# Patient Record
Sex: Male | Born: 1960 | Race: White | Hispanic: No | Marital: Married | Smoking: Never smoker
Health system: Southern US, Community
[De-identification: ages and names within clinical notes are randomized; demographics above are authoritative.]

## PROBLEM LIST (undated history)

## (undated) DIAGNOSIS — C801 Malignant (primary) neoplasm, unspecified: Secondary | ICD-10-CM

## (undated) DIAGNOSIS — I10 Essential (primary) hypertension: Secondary | ICD-10-CM

## (undated) HISTORY — PX: JOINT REPLACEMENT: SHX530

## (undated) HISTORY — PX: OTHER SURGICAL HISTORY: SHX169

---

## 2002-04-24 ENCOUNTER — Encounter: Payer: Self-pay | Admitting: Orthopedic Surgery

## 2002-04-30 ENCOUNTER — Encounter: Payer: Self-pay | Admitting: Orthopedic Surgery

## 2002-04-30 ENCOUNTER — Inpatient Hospital Stay (HOSPITAL_COMMUNITY): Admission: RE | Admit: 2002-04-30 | Discharge: 2002-05-03 | Payer: Self-pay | Admitting: Orthopedic Surgery

## 2002-07-24 ENCOUNTER — Encounter: Payer: Self-pay | Admitting: Orthopedic Surgery

## 2002-07-24 ENCOUNTER — Ambulatory Visit (HOSPITAL_COMMUNITY): Admission: RE | Admit: 2002-07-24 | Discharge: 2002-07-24 | Payer: Self-pay | Admitting: Orthopedic Surgery

## 2004-03-09 ENCOUNTER — Inpatient Hospital Stay (HOSPITAL_COMMUNITY): Admission: RE | Admit: 2004-03-09 | Discharge: 2004-03-11 | Payer: Self-pay | Admitting: Orthopedic Surgery

## 2005-01-13 ENCOUNTER — Encounter: Admission: RE | Admit: 2005-01-13 | Discharge: 2005-01-13 | Payer: Self-pay | Admitting: Orthopedic Surgery

## 2006-11-03 ENCOUNTER — Emergency Department (HOSPITAL_COMMUNITY): Admission: EM | Admit: 2006-11-03 | Discharge: 2006-11-03 | Payer: Self-pay | Admitting: Emergency Medicine

## 2006-11-03 ENCOUNTER — Ambulatory Visit: Payer: Self-pay | Admitting: Vascular Surgery

## 2006-11-08 ENCOUNTER — Encounter: Admission: RE | Admit: 2006-11-08 | Discharge: 2006-11-08 | Payer: Self-pay | Admitting: *Deleted

## 2010-04-19 ENCOUNTER — Encounter: Payer: Self-pay | Admitting: *Deleted

## 2010-08-14 NOTE — Discharge Summary (Signed)
Eric Zhang, Eric Zhang              ACCOUNT NO.:  [id number]   MEDICAL RECORD NO.:  [id number]          PATIENT TYPE:  INP   LOCATION:  0483                         FACILITY:  [facility]   PHYSICIAN:  Ollen Gross, M.D.    DATE OF BIRTH:  January 26, 1961   DATE OF ADMISSION:  03/09/2004  DATE OF DISCHARGE:  03/11/2004                                 DISCHARGE SUMMARY   ADMISSION DIAGNOSES:  1.  Avascular necrosis, right hip.  2.  History of blood clots.  3.  Eczema.  4.  Past history of prepatellar bursitis with Staphylococcal infection.  5.  Status post left total hip arthroplasty, February 2004.   DISCHARGE DIAGNOSES:  1.  Avascular necrosis, right hip, status post right total hip arthroplasty.  2.  History of blood clots.  3.  Eczema.  4.  Past history of prepatellar bursitis with Staphylococcal infection.  5.  Status post left total hip arthroplasty, February 2004.   PROCEDURE:  Date of surgery:  March 09, 2004, right total hip  arthroplasty.  Surgeon:  Dr. Lequita Halt.  Assistant:  Avel Peace, PA-C.  Anesthesia:  General.  Blood loss:  350 mL.  Hemovac drain x 1.   CONSULTS:  None.   BRIEF HISTORY:  Eric Zhang is a 50 year old male with osteonecrosis of the right  hip, stage 4 disease, and collapse of the femoral head.  He had a previous  left total hip and done well and now presents for his right total hip.   LABORATORY DATA:  CBC preop:  Hemoglobin 12.8, hematocrit 37.3, normal  differential, postop hemoglobin 10.9, H&H 10.4 and 30.3.  PT/PTT preop 11.9  and 26, respectively, and INR 0.8.  Serial pro times followed.  Last noted  PT/INR 13.5 and 1.0.  Chem panel on admission all within normal limits.  Serial BMETs were followed.  Glucose went up from 109 to 130 and back down  to 125.  Electrolytes remained within normal limits.  Preop UA moderate  hemoglobin, positive protein, 0-2 red cells, otherwise negative.  Blood  group type A positive.   Hip and pelvis films on March 02, 2004:  Status post total hip  arthroplasty without complications.   EKG, March 05, 2004:  Normal sinus rhythm, normal EKG, confirmed by Dr.  Lenise Herald.  Two-view chest preop on March 05, 2004:  No active  disease.  Preop hip films:  Severe right hip avascular necrosis, also left  hip prosthesis noted.   HOSPITAL COURSE:  The patient admitted to [facility] and underwent  the procedure, tolerated the procedure well and later returned to the  recovery room, was placed on Coumadin and Lovenox for concomitant coverage  for a history of clot.  He did have a little of nausea on the evening of  surgery.  It was a little bit better by the next day, treated with  antiemetics, started to get up with physical therapy.  By day two, the  nausea was better.  His headache, which he also had on day one, had  improved.  He was feeling much better, asking about the  possibility of going  home.  He progressed well with his physical therapy and on the following  day, March 11, 2004, he was tolerating his medications.  He had been  weaned over to p.o. medications.  He was up ambulating with therapy and was  discharged home.   DISCHARGE MEDICATIONS AND PLAN:  The patient discharged home on March 11, 2004.   DISCHARGE DIAGNOSES:  Please see above.   DISCHARGE MEDICATIONS:  Percocet, Robaxin, and Coumadin.   DIET:  Resume previous home diet.   ACTIVITY:  Partial weightbearing 50% right lower extremity.  Gait training,  ambulation, ADLs.  Home health PT, home health nursing.  May start  showering.  Daily dressing change.  Follow up 2 weeks from surgery.   DISPOSITION:  Home.   CONDITION ON DISCHARGE:  Improved.      ALP/MEDQ  D:  04/22/2004  T:  04/22/2004  Job:  [id number]

## 2010-08-14 NOTE — H&P (Signed)
NAME:  Eric Zhang, Eric Zhang NO.:  [id number]   MEDICAL RECORD NO.:  [id number]                   PATIENT TYPE:  INP   LOCATION:  0465                                 FACILITY:  [facility]   PHYSICIAN:  Ollen Gross, M.D.                 DATE OF BIRTH:  11/02/1960   DATE OF ADMISSION:  04/30/2002  DATE OF DISCHARGE:                                HISTORY & PHYSICAL   DATE OF OFFICE VISIT HISTORY AND PHYSICAL:  April 26, 2002.   CHIEF COMPLAINT:  Left hip pain.   HISTORY OF PRESENT ILLNESS:  The patient is a 50 year old male who has been  seen by Dr. Lequita Halt for hip pain.  He is noted to have avascular necrosis of  both hips.  The left hip, unfortunately, is more symptomatic than the right.  The pain in his hips has been going on for approximately over a year now.  It initially started out that the right hip was more painful.  However,  recently the left hip started to affect him more and has been a crescendo-  type pain.  He was seen in follow-up after his MRI.  It did show early  collapse with over 50% involvement of the femoral head on the left.  It is  discussed with him the most predictable means of improving his pain was  total hip replacement.  This was discussed at length with the patient.  Due  to his young age it was recommended metal-on-metal bearing surface.  The  risks and benefits were discussed, and the patient has elected to proceed  with surgery.   ALLERGIES:  No known drug allergies.   CURRENT MEDICATIONS:  Hydrocodone for pain.   PAST MEDICAL HISTORY:  1. History of AVN.  2. History of phlebitis.   PAST SURGICAL HISTORY:  Negative.   SOCIAL HISTORY:  He is married, no children.  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   TOBACCO/ALCOHOL:  Approximately 20 years for three-pack per day; however,  quit four years ago.  Drinks approximately one-half pint a day of alcohol;  however, he has stopped this recently.   FAMILY HISTORY:   Mother living, age 83, with a history of MI, breast cancer,  and also intestinal bypass.  Father living, age 85, with carotid disease.   REVIEW OF SYSTEMS:  GENERAL:  No fevers, chills, or night sweats.  NEUROLOGIC:  No seizures, syncope, or paralysis.  RESPIRATORY:  No shortness  of breath, productive cough, or hemoptysis.  CARDIOVASCULAR:  No chest pain,  angina, or orthopnea.  GASTROINTESTINAL:  No nausea, vomiting, diarrhea, or  constipation.  GENITOURINARY:  No dysuria, hematuria, or discharge.  MUSCULOSKELETAL:  Pertinent to both hips as found in the history of present  illness.   PHYSICAL EXAMINATION:  VITAL SIGNS:  Pulse 76, respirations 14, blood  pressure 122/82.  GENERAL:  The patient is a 50 year old  white male, well-nourished, well-  developed, large-framed individual.  Alert, oriented, and cooperative.  HEENT:  Normocephalic, atraumatic.  Pupils round and reactive.  Oropharynx  clear.  EOMs are intact.  NECK:  Supple.  No carotid bruits.  CHEST:  Clear to auscultation anterior and posterior chest walls.  HEART:  Regular rate and rhythm, with an early faint systolic murmur grade  2/6.  S1, S2 noted.  ABDOMEN:  Soft and nontender.  Slightly round.  Bowel sounds are present.  RECTAL, BREASTS, GENITALIA:  Not done.  Not pertinent to present illness.  EXTREMITIES:  Limited to that of both hips:  Both hips are able to flex up  to about 110 degrees, internal and external rotation of 20 and 40  respectively, with abduction of about 40 degrees.  He does ambulate with an  antalgic gait.  Please note, the left is more symptomatic than the right.   IMPRESSION:  1. Bilateral avascular necrosis of hips, left more symptomatic than right.  2. History of phlebitis.   PLAN:  The patient will be admitted to [facility] to undergo a  left total hip replacement arthroplasty.  Surgery will be performed by Dr.  Ollen Gross, with anticipation of a metal-on-metal bearing  surface.     Alexzandrew L. Julien Girt, P.A.              Ollen Gross, M.D.    ALP/MEDQ  D:  05/01/2002  T:  05/01/2002  Job:  [id number]

## 2010-08-14 NOTE — Discharge Summary (Signed)
NAME:  Eric Zhang, Eric Zhang                        ACCOUNT NO.:  [id number]   MEDICAL RECORD NO.:  [id number]                   PATIENT TYPE:  INP   LOCATION:  0465                                 FACILITY:  [facility]   PHYSICIAN:  Ollen Gross, M.D.                 DATE OF BIRTH:  April 12, 1960   DATE OF ADMISSION:  04/30/2002  DATE OF DISCHARGE:  05/03/2002                                 DISCHARGE SUMMARY   ADMITTING DIAGNOSES:  1. Bilateral avascular necrosis hips, left more symptomatic than right.  2. History of phlebitis.   DISCHARGE DIAGNOSES:  1. Avascular necrosis left hip status post left total hip replacement     arthroplasty.  2. Avascular necrosis right hip.  3. History of phlebitis.   PROCEDURE:  The patient was taken to the operating room on April 30, 2002.  Underwent a left total hip replacement arthroplasty.  Surgeon Ollen Gross,  M.D.  Assistant Alexzandrew L. Julien Girt, P.A.  Anesthesia general.  Estimated  blood loss 500 mL.  Hemovac drain x1.   CONSULTS:  None.   BRIEF HISTORY:  The patient is a 50 year old male seen by Ollen Gross,  M.D. for hip pain.  He has undergone outpatient work-up including MRI which  just showed avascular necrosis of both hips.  MRI showed early collapse and  over 50% involvement of the femoral head on the left.  The patient's pain  has been very progressive over the past year to the point where it is  interfering with his daily activities.  It is felt he would benefit from  undergoing total hip replacement.  Risks and benefits discussed.  The  patient is subsequently admitted to the hospital.   LABORATORY DATA:  CBC on admission:  Hemoglobin 13.4, hematocrit 37.9, white  cell count 5.9, red cell count 4.25.  Postoperative H&H 10.2 and 29.0.  Last  noted H&H 10.0 and 28.0.  The differential on the admission CBC all within  normal limits.  PT/PTT on admission 12.3 and 29, respectively.  INR 0.9 on  admission.  Serial pro times  followed.  Last noted PT/INR 14.2 and 1.1.  Chemistry panel on admission all within normal limits with the exception of  only minimally elevated glucose of 127.  Follow-up BMET after surgery:  Minimally elevated glucose of 135.  Remaining BMET all within normal limits.  Urinalysis showed small blood with 0-2 red cells.  Otherwise, urinalysis  negative.  Blood group type A+.   EKG dated April 24, 2002:  Normal sinus rhythm.  Nonspecific T-wave  abnormalities.  No previous tracing.  Confirmed by Olga Millers, M.D. [facility].  Preoperative chest x-ray dated April 24, 2002:  No active disease.  Left  hip films show aseptic necrosis of the left femoral head.  Pelvis film shows  aseptic necrosis of both right and left femoral heads.  Postoperative pelvis  and left hip films dated April 30, 2002:  Normal alignment at the level of  the left hip joint status post total hip arthroplasty.   HOSPITAL COURSE:  The patient was admitted to [facility], taken to  the OR, underwent above stated procedure without complications.  The patient  tolerated procedure well.  Later transferred to recovery room and then to  the orthopedic floor for continued postoperative care.  Vital signs were  followed.  Given 24 hours of post IV antibiotics.  Placed on Coumadin for  DVT prophylaxis.  With his history of phlebitis he was also placed on  Lovenox coverage until he was therapeutic.  He was placed on PC analgesics  for pain control following surgery.  Hemovac drain placed at time of surgery  was pulled on postoperative day one.  The patient had a fairly rough night  the first night but he was doing much better by postoperative day one.  He  was weaned over to p.o. medications and the PCA was discontinued by  postoperative day two.  He did have a mild headache which was his biggest  complaint on postoperative day two.  Otherwise, he was passing flatus and  his pain was under better control.  PT and OT were  consulted to assist with  gait training ambulation and ADLs postoperatively.  The patient did progress  very well with physical therapy, up ambulating approximately 60 feet by the  morning of postoperative day two and then 100 feet by that evening.  He was  even walking 200 feet by postoperative day three.  The patient was feeling  much better by postoperative day three, tolerating his p.o. medications, and  was ready to go home.  Discharge planning helped arrange post hospital care  and he was released later that day.   DISCHARGE PLAN:  The patient discharged home on May 03, 2002.   DISCHARGE DIAGNOSES:  Please see above.   DISCHARGE MEDICATIONS:  1. Coumadin as per pharmacy protocol.  2. Percocet for pain.  3. Robaxin for spasm.   DIET:  As tolerated.   FOLLOW UP:  The patient to follow up in the office around February 17 or  February 19.  Call the office for an appointment [phone].   ACTIVITY:  Total hip protocol.  [facility] for home health PT, home  health nursing.  He is touchdown weightbearing.  Hip precautions at all  times.   DISPOSITION:  Home.   CONDITION ON DISCHARGE:  Improved.     Alexzandrew L. Julien Girt, P.A.              Ollen Gross, M.D.    ALP/MEDQ  D:  05/25/2002  T:  05/25/2002  Job:  [id number]

## 2010-08-14 NOTE — Op Note (Signed)
Eric Zhang, Eric Zhang              ACCOUNT NO.:  [id number]   MEDICAL RECORD NO.:  [id number]          PATIENT TYPE:  INP   LOCATION:  0003                         FACILITY:  [facility]   PHYSICIAN:  Ollen Gross, M.D.    DATE OF BIRTH:  [birth year]   DATE OF PROCEDURE:  03/09/2004  DATE OF DISCHARGE:                                 OPERATIVE REPORT   PREOPERATIVE DIAGNOSIS:  Avascular necrosis, right hip.   POSTOPERATIVE DIAGNOSIS:  Avascular necrosis, right hip.   PROCEDURE:  Right total hip arthroplasty.   SURGEON:  Dr. Lequita Halt.   ASSISTANT:  Alexzandrew L. Julien Girt, P.A.   ANESTHESIA:  General.   ESTIMATED BLOOD LOSS:  350.   DRAINS:  Hemovac x1.   COMPLICATIONS:  None.   CONDITION:  Stable to recovery.   BRIEF CLINICAL NOTE:  Eric Zhang is a 50 year old male with osteonecrosis of his  right hip with stage IV disease and collapse of the femoral head.  He had a  previous left total hip arthroplasty which was very successful and presents  now for right total hip arthroplasty.   PROCEDURE IN DETAIL:  After successful administration of general anesthetic,  the patient was placed in the left lateral decubitus position with the right  side up and held with the hip positioner.  The right lower extremity was  isolated from his peroneum with plastic drapes and prepped and draped in the  usual sterile fashion.  A small posterolateral incision was made with a 10  blade through the subcutaneous tissue to the level of the fascia lata which  is incised in line with the skin incision.  The sciatic nerve is palpated  and protected and the short rotators isolated off the femur.  Capsulectomy  is performed and the hip is dislocated.  The center of the femoral head is  marked and a trial prosthesis placed such that the center of the trial head  corresponds to the center of his native femoral head.  The osteotomy line is  marked on the femoral neck and osteotomy made with an oscillating saw.   The  femoral head is then removed and acetabular exposure obtained.   The acetabular labrum and osteophytes were removed.  Reaming starts at a 47,  coursing increments of 2 to a 55 and then a 56 mm Pinnacle acetabular shell  is placed in anatomic position and transfixed with two dome screws.  A trial  36 mm neutral liner is placed.   The femur is addressed first with a canal finder and then irrigation.  Axial  reaming is performed to 15.5 mm, proximal reaming to a 20 F and the sleeve  reamed to a large.  A 20 F large sleeve was placed with a 20 x 15 stem and a  36+ 8 neck. We went 10 degrees beyond his native anteversion to get more  normal version.  A 36+ 0 head is then placed.  The hip is reduced with  excellent stability, full extension, full external rotation, 70 degrees of  flexion, 40 degrees of abduction, 90 degrees internal rotation and 90  degrees  flexion, 70 degrees internal rotation.  By placing the right leg on  top of the leg, the leg lengths are equal.  The hip is then dislocated and  all trials removed.  The permanent apex hole eliminator was placed into the  acetabular shell and then a permanent 36 mm neutral Ultramet metal liner is  placed into the shell.  This is a metal-on-metal hip replacement.  The  permanent 20 F large sleeve is placed into the proximal femur, then a 20 x  15 stem with a 36+ 8 neck, again 10 degrees beyond native anteversion.  The  36+ 0 head is then placed and the hip is reduced to the same stability  parameters.  The wound is copiously irrigated with saline solution and the  short rotators re-attached to the femur through drill holes.  The fascia  lata is closed over a Hemovac drain with interrupted #1 Vicryl, subcutaneous  closed with #1 and 2-0 Vicryl and subcuticular running 4-0 Monocryl.  A 20  cc of 0.25% Marcaine with epinephrine are injected into the subcutaneous  tissues.  The drain is hooked to suction.  A bulky sterile dressing is   applied and he is placed into a knee immobilizer, awakened and transported  to recovery in stable condition.     Drenda Freeze   FA/MEDQ  D:  03/09/2004  T:  03/09/2004  Job:  [id number]

## 2010-08-14 NOTE — H&P (Signed)
Eric Zhang, Eric Zhang              ACCOUNT NO.:  [id number]   MEDICAL RECORD NO.:  [id number]          PATIENT TYPE:  INP   LOCATION:  NA                           FACILITY:  [facility]   PHYSICIAN:  Ollen Gross, M.D.    DATE OF BIRTH:  09/11/1960   DATE OF ADMISSION:  03/09/2004  DATE OF DISCHARGE:                                HISTORY & PHYSICAL   DATE OF OFFICE VISIT/HISTORY AND PHYSICAL:  February 27, 2004.   DATE OF ADMISSION:  Tomorrow, March 09, 2004.   CHIEF COMPLAINT:  Right hip pain.   HISTORY OF PRESENT ILLNESS:  The patient is a 50 year old male who is well  known to Dr. Ollen Gross, having bilateral avascular necrosis, both hips.  He has previously undergone a left total hip arthroplasty in February of  last year and is doing extremely well from that.  He now presents for  continued progressive pain in the right hip associated with AVN.  He has  reached the point where he would like to have surgical intervention.  Risks  and benefits are discussed.  The patient is subsequently admitted to the  hospital.   ALLERGIES:  No known drug allergies.   CURRENT MEDICATIONS:  Diclofenac and Vicodin.   PAST MEDICAL HISTORY:  1.  Eczema.  2.  Prepatellar bursitis, with a staph infection.  3.  History of blood clots.   PAST SURGICAL HISTORY:  Left total hip arthroplasty, February 2004.   SOCIAL HISTORY:  Married.  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  Past smoker, stopped about 5-6 years ago, did smoke for  about 20 years.  Alcohol intake:  Occasional pint/six-pack daily.   FAMILY HISTORY:  Mother with a history of breast cancer and stroke.  Also,  mother with history of colitis.   REVIEW OF SYSTEMS:  GENERAL:  No fevers, chills or night sweats.  NEURO:  No  seizures, syncope or paralysis.  RESPIRATORY:  No shortness of breath,  productive cough or hemoptysis.  CARDIOVASCULAR:  No chest pain, angina or  orthopnea.  GI:  No nausea, vomiting,  diarrhea or constipation.  GU:  No  dysuria, hematuria or discharge.  MUSCULOSKELETAL:  Associated with the  right hip found in the history of present illness.   PHYSICAL EXAMINATION:  VITAL SIGNS:  Pulse 68, respirations 12, blood  pressure 133/82.  GENERAL:  A 50 year old white male, well nourished, well developed, a large  frame, no acute distress.  He is alert, oriented, cooperative and pleasant.  HEENT:  Normocephalic, atraumatic.  Pupils equal, round and reactive.  Oropharynx clear.  EOMs intact.  NECK:  Supple.  CHEST:  Clear, anterior and posterior chest walls.  No rhonchi, rales or  wheezing.  HEART:  Regular in rate and rhythm.  S1, S2 noted.  ABDOMEN:  Soft and nontender.  Bowel sounds present.  RECTAL/BREASTS/GENITALIA:  Not done.  Not pertinent to present illness.  EXTREMITIES:  Right lower extremity:  Hip flexion only shows 90 degrees.  There is only about 10 degrees of internal rotation, about 20  degrees of  external rotation and about 20 degrees of abduction.  Does ambulate with an  antalgic gait.  Does have pain on passive range of motion of the right hip.   IMPRESSION:  1.  Avascular necrosis, right hip.  2.  History of clots.  3.  Eczema.  4.  Past history of prepatellar bursitis with staphylococcal infection.  5.  Status post left total hip arthroplasty, February 2004.   PLAN:  Patient admitted to [facility] to undergo right total hip  arthroplasty.  Surgery will be performed by Dr. Ollen Gross.     Alex   ALP/MEDQ  D:  03/08/2004  T:  03/09/2004  Job:  [id number]   cc:   Ollen Gross, M.D.  [facility]  [address]  [address]  [place]  [place] [address]  Fax: [phone]

## 2010-08-14 NOTE — Op Note (Signed)
NAME:  Eric Zhang, Eric Zhang                        ACCOUNT NO.:  [id number]   MEDICAL RECORD NO.:  [id number]                   PATIENT TYPE:  INP   LOCATION:  X001                                 FACILITY:  [facility]   PHYSICIAN:  Ollen Gross, M.D.                 DATE OF BIRTH:  Aug 17, 1960   DATE OF PROCEDURE:  04/30/2002  DATE OF DISCHARGE:                                 OPERATIVE REPORT   PREOPERATIVE DIAGNOSIS:  Avascular necrosis, left hip.   POSTOPERATIVE DIAGNOSIS:  Avascular necrosis, left hip.   PROCEDURE:  Left total hip arthroplasty.   SURGEON:  Ollen Gross, M.D.   ASSISTANT:  Alexzandrew L. Julien Girt, P.A.   ANESTHESIA:  General.   ESTIMATED BLOOD LOSS:  500.   DRAINS:  Hemovac x1.   COMPLICATIONS:  None.   CONDITION:  Stable to recovery.   BRIEF CLINICAL NOTE:  The patient is a 50 year old male with severe  avascular necrosis of both hips, the left more symptomatic than the right.  He has collapse of the left femoral head with high-grade involvement of the  head.  He presents now for total hip arthroplasty secondary to failure of  nonoperative management.   DESCRIPTION OF PROCEDURE:  After the successful administration of general  anesthetic, the patient is placed in the right lateral decubitus position  with the left side up and held with the hip positioner.  The left lower  extremity is isolated from his perineum with plastic drapes and prepped and  draped in the usual sterile fashion.  A posterolateral incision is made with  a 10 blade through subcutaneous tissue to the level of the fascia lata,  which is incised in line with the skin incision.  Sciatic nerve is palpated  and protected and the short external rotators isolated off the femur.  Capsulectomy is performed, the hip is dislocated and the center of the  femoral head marked.  A trial prosthesis is placed as the center of the  trial head corresponds to the center of his native femoral head.  The  osteotomy line is marked on the femoral neck and osteotomy made with an  oscillating saw.  The femoral head and neck are removed and the femur  retracted anteriorly.  Acetabular exposure was then obtained.   Labrum is removed and osteophyte removed anteriorly.  Reaming starts with a  45, coursing in increments of 2 to a 53, then a 54 mm Pinnacle acetabular  shell was placed in anatomic position and transfixed with two domed screws.  The trial 36 mm neutral liner is placed.   The femur is prepared first with the canal finder and then irrigation.  Axial reaming is performed up to 15.5 mm, proximal reaming up to a 82F, and  the sleeve machined to a large.  A 82F large trial sleeve is placed with a  20 x 15 stem, a 36 +8 neck, and  36 +0 head.  We first matched his native  anteversion, which was only about 10 degrees; thus, I anteverted it  approximately 10 degrees more to give 20 degrees anteversion.  The hip was  reduced with outstanding stability, full extension, full external rotation  to 70 degrees flexion, 40 degrees abduction, at 90 degrees internal rotation  and 90 degrees flexion and 70 degrees internal rotation.  The hip was then  dislocated and all the trials removed.  The permanent apex hole eliminator  was placed into the acetabular shell, then the permanent 36 mm neutral metal  liner is placed.  This is a metal-on-metal hip.  The permanent 23F large  sleeve is placed in the proximal femur and a 20 x 15 stem with a 36 +8 neck  placed.  We anteverted this about 10 degrees beyond his native version.  The  permanent 36 +0 head is placed, hip reduced, and the wound copiously  irrigated with antibiotic solution.  The short rotators are reattached to  the femur through drill holes.  Fascia lata is closed over a Hemovac drain  with interrupted #1 Vicryl, subcu closed with interrupted #1 and interrupted  2-0 Vicryl, and subcuticular running 4-0 Monocryl.  The incision was cleaned  and  dried and Steri-Strips and a bulky sterile dressing applied.  The  patient subsequently awakened and transported to recovery in stable  condition.                                               Ollen Gross, M.D.    FA/MEDQ  D:  04/30/2002  T:  04/30/2002  Job:  [id number]

## 2010-09-22 ENCOUNTER — Other Ambulatory Visit: Payer: Self-pay | Admitting: Orthopedic Surgery

## 2010-09-22 ENCOUNTER — Other Ambulatory Visit (HOSPITAL_COMMUNITY): Payer: Self-pay | Admitting: Orthopedic Surgery

## 2010-09-22 ENCOUNTER — Ambulatory Visit (HOSPITAL_COMMUNITY)
Admission: RE | Admit: 2010-09-22 | Discharge: 2010-09-22 | Disposition: A | Discharge status: Home / Self Care | Payer: BC Managed Care – PPO | Source: Ambulatory Visit | Attending: Orthopedic Surgery | Admitting: Orthopedic Surgery

## 2010-09-22 ENCOUNTER — Encounter (HOSPITAL_COMMUNITY): Payer: BC Managed Care – PPO

## 2010-09-22 DIAGNOSIS — Z01811 Encounter for preprocedural respiratory examination: Secondary | ICD-10-CM | POA: Insufficient documentation

## 2010-09-22 DIAGNOSIS — Z01812 Encounter for preprocedural laboratory examination: Secondary | ICD-10-CM | POA: Insufficient documentation

## 2010-09-22 DIAGNOSIS — Z01810 Encounter for preprocedural cardiovascular examination: Secondary | ICD-10-CM | POA: Insufficient documentation

## 2010-09-22 DIAGNOSIS — M1710 Unilateral primary osteoarthritis, unspecified knee: Secondary | ICD-10-CM | POA: Insufficient documentation

## 2010-09-22 DIAGNOSIS — Z01818 Encounter for other preprocedural examination: Secondary | ICD-10-CM

## 2010-09-22 DIAGNOSIS — I10 Essential (primary) hypertension: Secondary | ICD-10-CM | POA: Insufficient documentation

## 2010-09-22 LAB — BASIC METABOLIC PANEL WITH GFR
BUN: 18 mg/dL (ref 6–23)
CO2: 24 meq/L (ref 19–32)
Calcium: 9.9 mg/dL (ref 8.4–10.5)
Chloride: 103 meq/L (ref 96–112)
Creatinine, Ser: 0.86 mg/dL (ref 0.50–1.35)
GFR calc Af Amer: >60 mL/min
GFR calc non Af Amer: >60 mL/min
Glucose, Bld: 131 mg/dL — ABNORMAL HIGH (ref 70–99)
Potassium: 3.8 meq/L (ref 3.5–5.1)
Sodium: 136 meq/L (ref 135–145)

## 2010-09-22 LAB — URINALYSIS, ROUTINE W REFLEX MICROSCOPIC
Bilirubin Urine: NEGATIVE
Glucose, UA: 100 mg/dL — AB
Hgb urine dipstick: NEGATIVE
Ketones, ur: NEGATIVE mg/dL
Nitrite: NEGATIVE
Protein, ur: NEGATIVE mg/dL
Urobilinogen, UA: 0.2 mg/dL (ref 0.0–1.0)
pH: 5 (ref 5.0–8.0)

## 2010-09-22 LAB — CBC
HCT: 39.7 % (ref 39.0–52.0)
Hemoglobin: 13.6 g/dL (ref 13.0–17.0)
MCH: 29.7 pg (ref 26.0–34.0)
MCHC: 34.3 g/dL (ref 30.0–36.0)
MCV: 86.7 fL (ref 78.0–100.0)
Platelets: 195 K/uL (ref 150–400)
RBC: 4.58 MIL/uL (ref 4.22–5.81)
RDW: 13 % (ref 11.5–15.5)
WBC: 7.6 K/uL (ref 4.0–10.5)

## 2010-09-22 LAB — APTT: aPTT: 30 s (ref 24–37)

## 2010-09-22 LAB — PROTIME-INR
INR: 0.98 (ref 0.00–1.49)
Prothrombin Time: 13.2 s (ref 11.6–15.2)

## 2010-09-22 LAB — SURGICAL PCR SCREEN
MRSA, PCR: NEGATIVE
Staphylococcus aureus: POSITIVE — AB

## 2010-09-22 LAB — DIFFERENTIAL
Basophils Absolute: 0 10*3/uL (ref 0.0–0.1)
Eosinophils Absolute: 0.6 10*3/uL (ref 0.0–0.7)
Eosinophils Relative: 7 % — ABNORMAL HIGH (ref 0–5)
Lymphs Abs: 2.7 10*3/uL (ref 0.7–4.0)
Monocytes Absolute: 0.4 10*3/uL (ref 0.1–1.0)
Monocytes Relative: 6 % (ref 3–12)
Neutro Abs: 4 10*3/uL (ref 1.7–7.7)

## 2010-10-05 ENCOUNTER — Inpatient Hospital Stay (HOSPITAL_COMMUNITY)
Admission: RE | Admit: 2010-10-05 | Discharge: 2010-10-06 | DRG: 209 | Disposition: A | Discharge status: Home / Self Care | Payer: BC Managed Care – PPO | Source: Ambulatory Visit | Attending: Orthopedic Surgery | Admitting: Orthopedic Surgery

## 2010-10-05 DIAGNOSIS — E349 Endocrine disorder, unspecified: Secondary | ICD-10-CM | POA: Diagnosis present

## 2010-10-05 DIAGNOSIS — I10 Essential (primary) hypertension: Secondary | ICD-10-CM | POA: Diagnosis present

## 2010-10-05 DIAGNOSIS — Z86718 Personal history of other venous thrombosis and embolism: Secondary | ICD-10-CM

## 2010-10-05 DIAGNOSIS — Z01812 Encounter for preprocedural laboratory examination: Secondary | ICD-10-CM

## 2010-10-05 DIAGNOSIS — M1710 Unilateral primary osteoarthritis, unspecified knee: Principal | ICD-10-CM | POA: Diagnosis present

## 2010-10-05 DIAGNOSIS — Z96649 Presence of unspecified artificial hip joint: Secondary | ICD-10-CM

## 2010-10-05 LAB — TYPE AND SCREEN
ABO/RH(D): A POS
Antibody Screen: NEGATIVE

## 2010-10-05 LAB — ABO/RH: ABO/RH(D): A POS

## 2010-10-05 NOTE — H&P (Signed)
NAMENAVEED, HUMPHRES NO.:  [id number]  MEDICAL RECORD NO.:  [id number]  LOCATION:                                 FACILITY:  PHYSICIAN:  Madlyn Frankel. Charlann Boxer, M.D.  DATE OF BIRTH:  [birth year]  DATE OF ADMISSION: DATE OF DISCHARGE:                             HISTORY & PHYSICAL   DATE OF SURGERY:  October 05, 2010.  ADMITTING DIAGNOSIS:  Right knee medial compartment osteoarthritis.  HISTORY OF PRESENT ILLNESS:  This 50 year old gentleman with history of osteoarthritis of bilateral knees with failure of conservative treatment.  On the right knee, he has medial compartment osteoarthritis with well-maintained patellofemoral and lateral compartments, and after discussion of treatment, benefits, risks and options, the patient now scheduled for unicompartmental arthroplasty of his right knee.  Note, that his blood pressure is high today.  He does not have a medical doctor.  We will get him seen by Dr. Nicholos Johns for evaluation of his hypertension.  Also, of note, that he has a history of DVT in the past and actually has been worked up for that by Dr. Darnelle Catalan.  The last episode 10 years ago.  He has not had any problems in 10 years.  We will get Dr. Darrall Dears records and review that.  Depending these issues, surgery will go ahead as scheduled.  He is not a candidate for tranexamic acid and will not receive that preop and his home medicines were not given to home at the [facility].  PAST MEDICAL HISTORY:  DRUG ALLERGIES:  None.  CURRENT MEDICATIONS:  AndroGel 1% 1 packet in the morning and diclofenac.  PREVIOUS SURGERIES:  Include bilateral total hip arthroplasty.  SERIOUS MEDICAL ILLNESSES:  Include history of DVT.  FAMILY HISTORY:  Positive for cancer, coronary artery disease, CVA, diabetes.  SOCIAL HISTORY:  The patient is married.  He is a [employer].  He used to smoke, but does not smoke any more, only drinks on the weekends.  REVIEW OF SYSTEMS:   CENTRAL NERVOUS SYSTEM:  Negative for headache, blurred vision or dizziness.  PULMONARY:  Negative for shortness breath, PND or orthopnea.  CARDIOVASCULAR:  Negative for chest pain or palpitation.  GI:  Negative for ulcers, hepatitis.  GU: Negative for urinary tract difficulty.  MUSCULOSKELETAL:  Positive in HPI plus with a history of DVTs in the past.  PHYSICAL EXAMINATION:  VITAL SIGNS:  BP 160/90, respirations 18, pulse 80 and regular. GENERAL APPEARANCE:  This is a well-developed, well-nourished overweight gentleman in no acute distress. HEENT:  Head is normocephalic.  Nose patent.  Ears patent.  Pupils equal, round and reactive to light.  Throat without injection.  NECK: Supple without adenopathy.  Carotids 2+ without bruit. CHEST:  Clear to auscultation.  No rales or rhonchi.  Respirations 18. HEART:  Regular rate and rhythm at 80 beats per minute without murmur. ABDOMEN:  Soft.  Active bowel sounds.  No masses, organomegaly. NEUROLOGIC:  The patient is alert and oriented to time, place and person.  Cranial nerves II-XII grossly intact. EXTREMITIES:  Shows the bilateral knees with a varus deformity. Neurovascular status intact.  No calf tenderness.  No cords.  Negative Homans sign.  0-125 degree range of motion of the right knee.  IMPRESSION:  Right knee medial osteoarthritis.  PLAN:  Right knee unicompartmental arthroplasty signs.     Jaquelyn Bitter. Chabon, P.A.   ______________________________ Madlyn Frankel Charlann Boxer, M.D.    SJC/MEDQ  D:  09/22/2010  T:  09/23/2010  Job:  [id number]  Electronically Signed by Jodene Nam P.A. on 10/04/2010 04:53:35 PM Electronically Signed by Durene Romans M.D. on 10/05/2010 09:16:14 AM

## 2010-10-06 LAB — BASIC METABOLIC PANEL
BUN: 20 mg/dL (ref 6–23)
CO2: 22 mEq/L (ref 19–32)
Calcium: 9.3 mg/dL (ref 8.4–10.5)
Creatinine, Ser: 0.89 mg/dL (ref 0.50–1.35)
GFR calc Af Amer: >60 mL/min (ref >60)
GFR calc non Af Amer: >60 mL/min (ref >60)
Glucose, Bld: 152 mg/dL — ABNORMAL HIGH (ref 70–99)
Potassium: 4.4 mEq/L (ref 3.5–5.1)
Sodium: 134 mEq/L — ABNORMAL LOW (ref 135–145)

## 2010-10-06 LAB — CBC
HCT: 36.7 % — ABNORMAL LOW (ref 39.0–52.0)
Hemoglobin: 12.2 g/dL — ABNORMAL LOW (ref 13.0–17.0)
MCH: 29 pg (ref 26.0–34.0)
MCHC: 33.2 g/dL (ref 30.0–36.0)
MCV: 87.2 fL (ref 78.0–100.0)
Platelets: 181 10*3/uL (ref 150–400)
RBC: 4.21 MIL/uL — ABNORMAL LOW (ref 4.22–5.81)
RDW: 13 % (ref 11.5–15.5)

## 2010-10-06 NOTE — Discharge Summary (Signed)
  NAMEJERIN, Eric Zhang NO.:  [id number]  MEDICAL RECORD NO.:  [id number]  LOCATION:  1603                         FACILITY:  [facility]  PHYSICIAN:  Madlyn Frankel. Charlann Boxer, M.D.  DATE OF BIRTH:  05-Feb-1961  DATE OF ADMISSION:  10/05/2010 DATE OF DISCHARGE:  10/06/2010                              DISCHARGE SUMMARY   ADMITTING DIAGNOSIS:  Right knee medial compartment osteoarthritis.  DISCHARGE DIAGNOSES: 1. Right knee medial compartment osteoarthritis. 2. History of deep venous thrombosis and low testosterone.  ADMITTING HISTORY:  [name] is a 50 year old male, who presented to the office and was evaluated and manage for bilateral knee osteoarthritis probably medial compartment with progressive loss of functional and quality of life.  He wished at this point to proceed with more definitive measures.  We discussed knee arthroplasty in terms of partial versus total knee replacement.  He wished at this point to proceed with partial knee replacement.  Risks and benefits were discussed.  Consent was obtained.  HOSPITAL COURSE:  Patient was admitted for same-day surgery on October 05, 2010.  He underwent a right partial knee replacement.  He did very well with this.  Postoperatively, after routine stay in the recovery room, he was transferred to the orthopedic ward where he remained for his day hospital stay.  On postoperative day #1, he was noted to have a hematocrit of 36.7.  His electrolytes remained stable.  His Hemovac drain was removed.  Foley catheter was removed.  He was seen and evaluated by Physical Therapy, did exceptionally well and was ready to go home by the afternoon.  DISCHARGE INSTRUCTIONS:  Patient will be discharged home and continue to use TED hose and Ace wraps to help keep swelling down in his knee using the ice.  He may shower and change his dressing out in 8 days to gauze and tape.  FOLLOWUP:  We will see back in the office at [facility], [address] in two weeks' time.  DISCHARGE MEDICATIONS:  His discharge medications will include: 1. Colace 100 mg p.o. b.i.d. as needed for constipation while on pain     medicine. 2. MiraLax 17 grams p.o. daily once to twice a day as needed for     constipation while on pain medicine. 3. Robaxin 500 mg every 6 hours as needed for pain. 4. Norco 7.5/325 one to two tablets every four to six hours as needed     for pain. 5. AndroGel one application daily. 6. Diclofenac 75 mg b.i.d. as needed, but she is not to use it much     anymore.     Madlyn Frankel Charlann Boxer, M.D.     MDO/MEDQ  D:  10/06/2010  T:  10/06/2010  Job:  [id number]  Electronically Signed by Durene Romans M.D. on 10/06/2010 06:17:46 PM

## 2010-10-06 NOTE — Op Note (Signed)
NAMEHAGEN, BOHORQUEZ NO.:  [id number]  MEDICAL RECORD NO.:  [id number]  LOCATION:  1603                         FACILITY:  [facility]  PHYSICIAN:  Madlyn Frankel. Charlann Boxer, M.D.  DATE OF BIRTH:  1960/12/11  DATE OF PROCEDURE:  10/05/2010 DATE OF DISCHARGE:                              OPERATIVE REPORT   PREOPERATIVE DIAGNOSIS:  Right knee medial compartment osteoarthritis.  POSTOPERATIVE DIAGNOSIS:  Right knee medial compartment osteoarthritis.  PROCEDURE:  Right medial compartment partial knee replacement utilizing a Biomet Oxford knee system size large femur C, right medial tibial tray, and a size 4 insert.  SURGEON:  Madlyn Frankel. Charlann Boxer, MD  ASSISTANT:  Jaquelyn Bitter. Chabon, PA-C  ANESTHESIA:  Preoperative regional femoral nerve block and general anesthetic.  COMPLICATIONS:  None.  SPECIMENS:  None.  DRAINS:  One Hemovac.  TOURNIQUET TIME:  51 minutes at 250 mmHg.  INDICATIONS OF PROCEDURE:  [name] is a 50 year old male who had been seen and evaluated and followed in the office for bilateral knee arthritis.  Predominantly, symptoms were medially.  After failing conservative measures, we discussed surgical options.  He was not a candidate for further arthroscopic surgery based on the advanced nature of degenerative changes.  He wished to proceed with more definitive measures.  He was a candidate based on his presentation radiographic findings of an anterior medial wear pattern for partial versus a total knee replacement.  We reviewed the risks and benefits, pros and cons of this procedure and he wished at this point to proceed with a partial knee replacement versus total knee replacement.  Consent was obtained after reviewing the risks of infection, DVT, component failure, progression of degenerative changes, and need for conversion to a total knee replacement at some point.  PROCEDURE IN DETAIL:  The patient was brought to operative theater. Once adequate  anesthesia, preoperative antibiotics, Ancef administered, the patient was positioned supine with the right thigh tourniquet placed.  The right leg was then positioned over the Oxford leg holder with the foot of the table dropped to his leg was flexed 120 degrees, then left leg was positioned, padded, and protected appropriately.  A time-out was performed identifying the patient, planned procedure, and extremity.  Leg was exsanguinated, tourniquet elevated to 250 mmHg.  A paramidline incision was made from the proximal pole of the patella to his tubercle.  Soft tissue planes created.  A partial median arthrotomy was made to allow for patella subluxation.  Following initial exposure of synovial debridement, attention was directed to proximal medial tibia with retractors placed.  Large #1 spoon was placed on to the proximal tibial, underneath the femur.  The extramedullary guide was then positioned with a 4 mm cutting block in position.  Once this was positioned, I felt that it was parallel to the shaft of the tibia.  The reciprocating saw and oscillating saw were used to further proximal tibial cut.  However, when I placed a C tibial tray on top of this, I was unable to get the 4 feeler gauge in this gap.  I went ahead and removed the 2 mm shim and resected 2 mm of bone.  Following this resection, the gap appeared  to be appropriately stable with a 4 mm insert on top of the shim.  Given these findings, the attention was now directed to femur and the femur canal was opened with a drill, and starting awl and intramedullary rod passed.  Based on the intramedullary rod as well as the proximal tibia,  the guide was inserted for this.  Once positioned within the central 3rd of the medial femoral condyle, knee was held in position with the guide.  The 2 drills holes were made into the distal femur. The posterior cutting block was then tapped into position and posterior cut made.  At this point,  based on the flexion gap at 90 degrees, I chose a 4 mm spigot and the distal femur was milled.  Following this, the excessive bone was removed.  The trial reduction was carried out with a large femoral component, the C tibial tray.  The 4 insert was positioned and I felt the knee was balanced from extension to flexion, particularly from 20 degrees of flexion down to 90.  Given all these findings, final preparation was carried out and creating a trough for the keeled component using a combination of the reciprocating saw on the keel and the chisel.  The final preparation and milling of the anterior aspect of distal femur was carried out.  Final components were opened and the cement mixed.  We drilled sclerotic bone.  The synovial capsule junction of the knee was injected with 0.25% Marcaine with epinephrine 1 cc of Toradol, total 31 cc.  Final components were cemented with a single batch of cement 2 stages with the tibial component cemented first.  The knee was brought to 45 degrees of flexion and extruded cement was removed.  After a minute and half, the femoral component was cemented into position and again the knee held at45 degrees of flexion.  Once the cement had fully cured and excessive cement was removed throughout the knee and then satisfied there was no visualized cement anywhere, the final 4 insert was chosen based on the trial.  The 4 insert was then snapped into position.  The knee was re- irrigated.  The tourniquet had been let down after 51 minutes.  No significant hemostasis required though there was some oozing.  A medium Hemovac drain was placed deep.  Extensor mechanism was then re- approximated using #1 Vicryl with the knee in flexion.  The remaining of the wound was closed with 2-0 Vicryl and running 4-0 Monocryl.  The knee was cleaned, dried, and dressed sterilely using Dermabond and Aquacel dressing.  He was then brought to recovery room in stable  condition, extubated, tolerating procedure well.  Drain site dressed separately, Ace wrap applied.     Madlyn Frankel Charlann Boxer, M.D.     MDO/MEDQ  D:  10/05/2010  T:  10/06/2010  Job:  [id number]  Electronically Signed by Durene Romans M.D. on 10/06/2010 06:17:42 PM

## 2010-12-01 ENCOUNTER — Other Ambulatory Visit: Payer: Self-pay | Admitting: Orthopedic Surgery

## 2010-12-01 ENCOUNTER — Encounter (HOSPITAL_COMMUNITY): Payer: BC Managed Care – PPO

## 2010-12-01 LAB — SURGICAL PCR SCREEN
MRSA, PCR: NEGATIVE
Staphylococcus aureus: POSITIVE — AB

## 2010-12-01 LAB — BASIC METABOLIC PANEL
BUN: 19 mg/dL (ref 6–23)
Calcium: 9.5 mg/dL (ref 8.4–10.5)
Chloride: 103 mEq/L (ref 96–112)
Creatinine, Ser: 0.99 mg/dL (ref 0.50–1.35)
GFR calc Af Amer: >60 mL/min (ref >60)
GFR calc non Af Amer: >60 mL/min (ref >60)
Glucose, Bld: 112 mg/dL — ABNORMAL HIGH (ref 70–99)
Potassium: 4.3 mEq/L (ref 3.5–5.1)
Sodium: 138 mEq/L (ref 135–145)

## 2010-12-01 LAB — URINALYSIS, ROUTINE W REFLEX MICROSCOPIC
Bilirubin Urine: NEGATIVE
Hgb urine dipstick: NEGATIVE
Ketones, ur: NEGATIVE mg/dL
Leukocytes, UA: NEGATIVE
Nitrite: NEGATIVE
Protein, ur: NEGATIVE mg/dL
Specific Gravity, Urine: 1.025 (ref 1.005–1.030)
Urobilinogen, UA: 0.2 mg/dL (ref 0.0–1.0)
pH: 5 (ref 5.0–8.0)

## 2010-12-01 LAB — DIFFERENTIAL
Basophils Absolute: 0 10*3/uL (ref 0.0–0.1)
Basophils Relative: 0 % (ref 0–1)
Eosinophils Absolute: 0.5 10*3/uL (ref 0.0–0.7)
Eosinophils Relative: 7 % — ABNORMAL HIGH (ref 0–5)
Lymphocytes Relative: 34 % (ref 12–46)
Lymphs Abs: 2.4 10*3/uL (ref 0.7–4.0)
Monocytes Absolute: 0.5 10*3/uL (ref 0.1–1.0)
Monocytes Relative: 7 % (ref 3–12)
Neutro Abs: 3.6 10*3/uL (ref 1.7–7.7)
Neutrophils Relative %: 52 % (ref 43–77)

## 2010-12-01 LAB — CBC
HCT: 39.5 % (ref 39.0–52.0)
Hemoglobin: 13.1 g/dL (ref 13.0–17.0)
MCH: 29.4 pg (ref 26.0–34.0)
Platelets: 179 10*3/uL (ref 150–400)
RBC: 4.45 MIL/uL (ref 4.22–5.81)
WBC: 6.9 10*3/uL (ref 4.0–10.5)

## 2010-12-01 LAB — PROTIME-INR
INR: 0.99 (ref 0.00–1.49)
Prothrombin Time: 13.3 seconds (ref 11.6–15.2)

## 2010-12-01 LAB — APTT: aPTT: 30 seconds (ref 24–37)

## 2010-12-05 NOTE — H&P (Addendum)
NAMESAMUEL, MCPEEK NO.:  [id number]  MEDICAL RECORD NO.:  [id number]  LOCATION:                               FACILITY:  [facility]  PHYSICIAN:  Madlyn Frankel. Charlann Boxer, M.D.  DATE OF BIRTH:  July 22, 1960  DATE OF ADMISSION:  12/08/2010 DATE OF DISCHARGE:                             HISTORY & PHYSICAL   DATE OF SURGERY:  December 08, 2010.  DIAGNOSIS:  Left knee osteoarthritis (medial aspect).  HISTORY OF PRESENT ILLNESS:  The patient is a 50 year old white male in no acute distress.  The patient states he has had 10+ years of pain in bilateral knees.  The patient has had aspirations of the knees multiple times, pull off fluids and some swelling.  The patient has tried conservative treatments including steroid injections, all which have been ineffective.  X-rays of the left knee do show some arthritic changes of the medial aspect.  The patient has had a history of a right partial knee replacement in October 05, 2010.  This has been doing great with significant decrease in pain.  He wishes to proceed with right partial knee replacement.  Risks, benefits, and expectations of procedure were discussed with the patient.  The patient understands risks, benefits, and expectations, and wishes to proceed with surgery. The patient is planning on going home after surgery.  The patient has been given his postoperative medications including aspirin, Robaxin, iron, Colace, and MiraLax.  PAST MEDICAL HISTORY: 1. High blood pressure. 2. Eczema. 3. Arthritis.  PAST SURGICAL HISTORY: 1. Right knee partial replacement, October 05, 2010. 2. Left total hip replacement, April 30, 2002. 3. Right total hip replacement, March 09, 2004.  MEDICATIONS: 1. Metoprolol 50 mg one p.o. daily. 2. Diclofenac 75 mg one p.o. b.i.d. (has not taken this for quite some     time). 3. Dexamethasone 0.25% topically when needed, for eczema flares.  ALLERGIES:  No known drug allergies.  The patient is  allergic to food allergies including BANANAS and PLUMS.  SOCIAL HISTORY:  The patient denies use of tobacco.  The patient does admit to drinking 1-2 beers at weekend.  REVIEW OF SYSTEMS:  The patient does complain of eczema.  He also complains of joint pain, joint swelling, muscular pain, back pain, and morning stiffness.  PHYSICAL EXAMINATION:  GENERAL:  The patient is a 49 year old white male in no acute distress. VITAL SIGNS:  Stable.  Blood pressure is 157/92, respirations 18, pulse 82. HEENT:  Pupils are equal, round, and reactive to light and accommodation.  Throat is clear. NECK:  Supple.  No JVD.  No carotid bruits noted.  No lymphadenopathy noted. CARDIAC:  Normal-appearing S1 and S2.  No murmur appreciated. RESPIRATORY:  Lungs clear to auscultation bilaterally. NEUROLOGICAL:  The patient oriented x3 with those pertaining to the left knee.  The patient has no real pain on palpation of the knee.  The patient does have some crepitus in the knee with extension of his knee. The patient has good extension and flexion, pain over the medial aspect. The patient has good sensation distally.  The patient has posterior dorsalis pedis pulse.  Surgical site looks clear.  IMPRESSION:  Left knee osteoarthritis (medial  aspect).  STUDIES:  X-ray as above.  PLAN:  The patient will be admitted to hospital to undergo left unilateral knee replacement, medial side, by Dr. Charlann Boxer at [facility] on December 08, 2010.    ______________________________ Lanney Gins, PA   ______________________________ Madlyn Frankel. Charlann Boxer, M.D.    MB/MEDQ  D:  12/02/2010  T:  12/02/2010  Job:  [id number]  Electronically Signed by Lanney Gins PA on 12/08/2010 03:53:44 PM Electronically Signed by Durene Romans M.D. on 12/11/2010 07:29:41 AM

## 2010-12-08 ENCOUNTER — Ambulatory Visit (HOSPITAL_COMMUNITY)
Admission: RE | Admit: 2010-12-08 | Discharge: 2010-12-09 | Disposition: A | Discharge status: Home / Self Care | Payer: BC Managed Care – PPO | Source: Ambulatory Visit | Attending: Orthopedic Surgery | Admitting: Orthopedic Surgery

## 2010-12-08 DIAGNOSIS — M129 Arthropathy, unspecified: Secondary | ICD-10-CM | POA: Insufficient documentation

## 2010-12-08 DIAGNOSIS — Z01812 Encounter for preprocedural laboratory examination: Secondary | ICD-10-CM | POA: Insufficient documentation

## 2010-12-08 DIAGNOSIS — L259 Unspecified contact dermatitis, unspecified cause: Secondary | ICD-10-CM | POA: Insufficient documentation

## 2010-12-08 DIAGNOSIS — I10 Essential (primary) hypertension: Secondary | ICD-10-CM | POA: Insufficient documentation

## 2010-12-08 DIAGNOSIS — M25569 Pain in unspecified knee: Secondary | ICD-10-CM | POA: Insufficient documentation

## 2010-12-08 DIAGNOSIS — Z96649 Presence of unspecified artificial hip joint: Secondary | ICD-10-CM | POA: Insufficient documentation

## 2010-12-08 DIAGNOSIS — Z86718 Personal history of other venous thrombosis and embolism: Secondary | ICD-10-CM | POA: Insufficient documentation

## 2010-12-08 DIAGNOSIS — M1710 Unilateral primary osteoarthritis, unspecified knee: Secondary | ICD-10-CM | POA: Insufficient documentation

## 2010-12-08 DIAGNOSIS — Z79899 Other long term (current) drug therapy: Secondary | ICD-10-CM | POA: Insufficient documentation

## 2010-12-08 DIAGNOSIS — Z96659 Presence of unspecified artificial knee joint: Secondary | ICD-10-CM | POA: Insufficient documentation

## 2010-12-08 LAB — TYPE AND SCREEN
ABO/RH(D): A POS
Antibody Screen: NEGATIVE

## 2010-12-09 LAB — BASIC METABOLIC PANEL
BUN: 16 mg/dL (ref 6–23)
CO2: 25 mEq/L (ref 19–32)
Calcium: 8.7 mg/dL (ref 8.4–10.5)
Chloride: 103 mEq/L (ref 96–112)
Creatinine, Ser: 0.93 mg/dL (ref 0.50–1.35)
GFR calc Af Amer: >60 mL/min (ref >60)
GFR calc non Af Amer: >60 mL/min (ref >60)
Glucose, Bld: 145 mg/dL — ABNORMAL HIGH (ref 70–99)

## 2010-12-09 LAB — CBC
HCT: 33 % — ABNORMAL LOW (ref 39.0–52.0)
Hemoglobin: 11 g/dL — ABNORMAL LOW (ref 13.0–17.0)
MCH: 29.5 pg (ref 26.0–34.0)
MCV: 88.5 fL (ref 78.0–100.0)
Platelets: 176 10*3/uL (ref 150–400)
RBC: 3.73 MIL/uL — ABNORMAL LOW (ref 4.22–5.81)
RDW: 13.8 % (ref 11.5–15.5)
WBC: 9.9 10*3/uL (ref 4.0–10.5)

## 2010-12-09 LAB — PROTIME-INR: Prothrombin Time: 13.6 seconds (ref 11.6–15.2)

## 2010-12-11 NOTE — Op Note (Signed)
NAMEMALAKI, [name]:  [id number]  MEDICAL RECORD NO.:  [id number]  LOCATION:  0004                         FACILITY:  [facility]  PHYSICIAN:  Madlyn Frankel. Charlann Boxer, M.D.  DATE OF BIRTH:  [birth year]  DATE OF PROCEDURE:  12/08/2010 DATE OF DISCHARGE:                              OPERATIVE REPORT   PREOPERATIVE DIAGNOSIS:  Left knee medial compartment osteoarthritis.  POSTOPERATIVE DIAGNOSIS:  Left knee medial compartment osteoarthritis.  PROCEDURE:  Left partial knee replacement utilizing Biomet component, size large femur, C tibia, 3-mm insert.  SURGEON:  Madlyn Frankel. Charlann Boxer, M.D.  ASSISTANT:  Jaquelyn Bitter. Chabon, P.A.  ANESTHESIA:  Preoperative regional femoral block plus general anesthetic.  SPECIMEN:  None.  COMPLICATIONS:  None.  DRAINS:  One Hemovac.  TOURNIQUET TIME:  46 minutes at 250 mmHg.  INDICATIONS FOR PROCEDURE:  [name] is a 50 year old gentleman who had been seen and evaluated in the office for advanced bilateral knee arthritis, predominantly in the medial compartments with pain localized in this area.  He had failed conservative measures, and undergone a right partial knee replacement already.  He was here to get his left knee replaced based on the success of his right knee.  After reviewing the risks and benefits, pros and cons of this, he wished at this point to proceed in this fashion.  Consent was obtained for the benefit of pain relief.  PROCEDURE IN DETAIL:  The patient was brought to operative theater. Once adequate anesthesia, preoperative antibiotics, Ancef administered, the patient was positioned supine with a left thigh tourniquet placed. He was positioned on the table to allow for his knee be flexed to 120 degrees.  The foot of the table was dropped and the right leg was padded and positioned.  The left lower extremity was then prepped and draped in sterile fashion. A time-out was performed identifying the patient, planned  procedure, and extremity.  Leg was exsanguinated, tourniquet elevated to 250 mmHg.  Midline incision was made followed by median parapatellar arthrotomy.  Once soft tissue planes were created and synovectomy carried out, retractor was placed.  I removed medial notch osteophytes off the distal femur and the proximal tibia.  The extramedullary guide was positioned, and using a reciprocating saw, I first resected along the tibial spine and then used an oscillating saw to remove the tibial section.  This cut surface seemed to be best fit with size C as I had used on the contralateral knee.  With the retractors out of place, the space and the gaps appeared to be appropriate for size 3 insert.  At this point, femoral canal was opened and drilled and an awl and then intramedullary rod positioned.  Using the guide for the femur, it was then placed onto the proximal tibial cut and oriented per the technique.  The two drills holes were then drilled into the distal femur for the posterior cutting block.  Posterior cutting block was impacted in position and the posterior cut made.  At this point, I had a nice squared gap on this medial side.  I placed a 4-spigot into the distal femur and milled the distal femur, removing excessive bone.  The trial reduction  was now carried out with this large femoral trial, a C-tray and a 3-mm feeler gauge.  I felt that the knee ligaments were stable from extension and flexion.  At this point, final preparation of bone was carried out including the middle and the anterior aspect of distal femur to allow for sliding of the insert.  The tibial tray was pinned in position and the reciprocating saw used to remove bone from the keeled area.  The chisel was then used to remove remaining bone.  A trial reduction was then carried out with this large femoral trial, the keeled C with the 3-spoon in place, the knee ligaments were stable from extension of  flexion, allowing full extension.  At this point, all trial components were removed.  The drill for the sclerotic bone was used to prepare the bone further and inject it. While we were waiting for cementing mix, I injected the synovial capsular junction of the knee with 0.25% Marcaine with epinephrine 1 mL of Toradol.  The knee was irrigated with normal saline solution.  Cement mixed. Final components were opened.  Final components were cemented with one batch of cement with two stages with the tibial component cement at first.  Trial femur placed in the three feeler gauge, placed 45 degrees of flexion allowed for cement extrusion.  After a minute and a half, femoral component was cemented in position, held at 45 degrees of flexion to remove remaining cement.  Once the cement had fully cured, excessive cement was removed throughout the knee.  The final three insert was chosen and snapped into position.  Tourniquet had been let down after 46 minutes.  We re-irrigated the knee.  I placed a medium Hemovac drain deep.  The extensor mechanism was reapproximated with the knee in flexion using #1 Vicryl.  The remainder of the wound was closed with 2-0 Vicryl and running 4-0 Monocryl.  The knee was cleaned, dried, and dressed sterilely using Dermabond and Aquacel dressing.  Drain site dressed separately.  The patient was brought to recovery room, tolerating the procedure well.     Madlyn Frankel Charlann Boxer, M.D.     MDO/MEDQ  D:  12/08/2010  T:  12/08/2010  Job:  [id number]  Electronically Signed by Durene Romans M.D. on 12/11/2010 07:29:45 AM

## 2010-12-11 NOTE — Discharge Summary (Signed)
Eric Zhang, LANPHERE NO.:  [id number]  MEDICAL RECORD NO.:  [id number]  LOCATION:  1515                         FACILITY:  [facility]  PHYSICIAN:  Eric Zhang. Eric Zhang, M.D.  DATE OF BIRTH:  1960/06/08  DATE OF ADMISSION:  12/08/2010 DATE OF DISCHARGE:  12/09/2010                              DISCHARGE SUMMARY   Patient of Dr. Nilsa Nutting.  PROCEDURE:  Left unilateral knee replacement, medial compartment.  ADMITTING DIAGNOSIS:  Left knee osteoarthritis of medial aspect.  DISCHARGE DIAGNOSES: 1. Status post left unilateral knee replacement. 2. Hypertension. 3. Eczema. 4. Arthritis.  HISTORY OF PRESENT ILLNESS:  The patient is a 50 year old white male, in no acute distress.  The patient states that he has had 10 plus years of pain in bilateral knees.  The patient has had aspirations of the knees multiple times with full of fluid because of swelling.  The patient has had tried conservative treatments including steroid injections, all which have been ineffective in controlling symptoms.  X-rays of the left knee did show some arthritic changes in the medial aspect.  The patient has a history of right partial knee replacement on October 05, 2010.  This has been doing great, significant decrease in pain.  He wishes to proceed with partial knee replacement on the left side.  Risks, benefits, and expectations of procedure were discussed with the patient. The patient understands risks, benefits, and expectations and wishes to proceed with surgery.  HOSPITAL COURSE:  The patient underwent the above-stated procedure on December 08, 2010.  The patient tolerated the procedure well, was brought to the recovery room in good condition, and subsequently to the floor.  Postop day #1, December 09, 2010, the patient doing well, no events, afebrile, vital signs stable.  Hemoglobin and hematocrit is 11 over 33. The patient's Hemovac was taken out.  The patient had physical therapy. It  was felt the patient was doing well enough to be discharged home.  DISCHARGE CONDITION:  Good.  DISCHARGE INSTRUCTIONS:  The patient will be discharged home on December 09, 2010.  The patient will be weightbearing as tolerated. The patient should maintain a surgical dressing for 8 days after which time replace with gauze and tape.  The patient to keep the area dry and clean until followup.  The patient will follow up with Dr. Charlann Zhang at [facility] in 2 weeks.  The patient is to call with any questions or concerns.  DISCHARGE MEDICATIONS: 1. Aspirin enteric coated 325 mg 1 p.o. b.i.d. for 4 weeks. 2. Benadryl 25 mg 1 p.o. q.4 h. p.r.n. 3. Colace 100 mg 1 p.o. b.i.d. constipation. 4. Iron sulfate 325 mg 1 p.o. t.i.d. for 2-3 weeks. 5. Norco 7.5/325 one to two p.o. q.4-6 hours p.r.n. pain. 6. Robaxin 500 mg 1 p.o. q.6 h. p.r.n. muscle spasms. 7. MiraLax 17 g p.o. daily constipation. 8. Desoximetasone cream 0.25% topical 1 application topically p.r.n. 9. Metoprolol 50 mg 1 p.o. daily.    ______________________________ Eric Gins, PA   ______________________________ Eric Zhang. Eric Zhang, M.D.    MB/MEDQ  D:  12/09/2010  T:  12/09/2010  Job:  [id number]  Electronically Signed by Eric Gins PA on 12/09/2010 02:00:34 PM  Electronically Signed by Eric Zhang M.D. on 12/11/2010 07:29:49 AM

## 2010-12-17 NOTE — Op Note (Signed)
  NAMESIEGFRIED, VIETH NO.:  [id number]  MEDICAL RECORD NO.:  [id number]  LOCATION:                                 FACILITY:  PHYSICIAN:  Madlyn Frankel. Charlann Boxer, M.D.  DATE OF BIRTH:  [birth year]  DATE OF PROCEDURE:  12/08/2010 DATE OF DISCHARGE:                              OPERATIVE REPORT   ADDENDUM:  Physician assistant was utilized for the entire portion of the case, for preoperative position, perioperative position, retractor placement, and general facilitation of case.  Also directly involved with wound closure.     Madlyn Frankel Charlann Boxer, M.D.     MDO/MEDQ  D:  12/11/2010  T:  12/11/2010  Job:  [id number]  Electronically Signed by Durene Romans M.D. on 12/17/2010 08:39:13 PM

## 2011-08-30 ENCOUNTER — Emergency Department (HOSPITAL_COMMUNITY)
Admission: EM | Admit: 2011-08-30 | Discharge: 2011-08-30 | Disposition: A | Discharge status: Home / Self Care | Payer: BC Managed Care – PPO | Attending: Emergency Medicine | Admitting: Emergency Medicine

## 2011-08-30 ENCOUNTER — Encounter (HOSPITAL_COMMUNITY): Payer: Self-pay | Admitting: Emergency Medicine

## 2011-08-30 DIAGNOSIS — L03119 Cellulitis of unspecified part of limb: Secondary | ICD-10-CM | POA: Insufficient documentation

## 2011-08-30 DIAGNOSIS — I10 Essential (primary) hypertension: Secondary | ICD-10-CM | POA: Insufficient documentation

## 2011-08-30 DIAGNOSIS — M79669 Pain in unspecified lower leg: Secondary | ICD-10-CM

## 2011-08-30 DIAGNOSIS — L0390 Cellulitis, unspecified: Secondary | ICD-10-CM

## 2011-08-30 DIAGNOSIS — L02419 Cutaneous abscess of limb, unspecified: Secondary | ICD-10-CM | POA: Insufficient documentation

## 2011-08-30 HISTORY — DX: Essential (primary) hypertension: I10

## 2011-08-30 HISTORY — DX: Malignant (primary) neoplasm, unspecified: C80.1

## 2011-08-30 MED ORDER — ENOXAPARIN SODIUM 150 MG/ML ~~LOC~~ SOLN
1.0000 mg/kg | Freq: Once | SUBCUTANEOUS | Status: AC
  Administered 2011-08-30: 130 mg via SUBCUTANEOUS
  Filled 2011-08-30: qty 1

## 2011-08-30 MED ORDER — ONDANSETRON HCL 4 MG PO TABS: 4.0000 mg | ORAL_TABLET | Freq: Four times a day (QID) | ORAL | Status: AC

## 2011-08-30 MED ORDER — CLINDAMYCIN HCL 150 MG PO CAPS: 150.0000 mg | ORAL_CAPSULE | Freq: Four times a day (QID) | ORAL | Status: AC

## 2011-08-30 NOTE — ED Notes (Addendum)
Pt denies pain when he walks or with legs elevated. Pt denies any shortness of breath or any neuro deficits.

## 2011-08-30 NOTE — ED Provider Notes (Signed)
History     CSN: [id number]  Arrival date & time 08/30/11  2130   First MD Initiated Contact with Patient 08/30/11 1917      Chief Complaint  Patient presents with  . Leg Pain    (Consider location/radiation/quality/duration/timing/severity/associated sxs/prior treatment) HPI  Patient presents to the ED with complaints of right calf pain and right shin redness and pain with palpation. He denies injury. Pt has had lower and upper extremity blood clots in the past. He also has had bilateral hip and knee surgeries within the past 10 years. Last one was over 6 months ago. He denies any chest pain, difficulty breathing, weakness, nausea, vomiiting or diarrhea. He says he called his PCP but can not get in for a couple months.  Past Medical History  Diagnosis Date  . Hypertension   . Cancer     Past Surgical History  Procedure Date  . Joint replacement   . Blood clot     No family history on file.  History  Substance Use Topics  . Smoking status: Never Smoker   . Smokeless tobacco: Not on file  . Alcohol Use: Yes     weekends      Review of Systems   HEENT: denies blurry vision or change in hearing PULMONARY: Denies difficulty breathing and SOB CARDIAC: denies chest pain or heart palpitations MUSCULOSKELETAL:  denies being unable to ambulate ABDOMEN AL: denies abdominal pain GU: denies loss of bowel or urinary control NEURO: denies numbness and tingling in extremities PSYCH: patient behavior is normal NECK: Not complaining of neck pain     Allergies  Review of patient's allergies indicates no known allergies.  Home Medications   Current Outpatient Rx  Name Route Sig Dispense Refill  . METOPROLOL SUCCINATE ER 25 MG PO TB24 Oral Take 25 mg by mouth daily.    . TESTOSTERONE 50 MG/5GM TD GEL Transdermal Place 5 g onto the skin daily.    Marland Kitchen CLINDAMYCIN HCL 150 MG PO CAPS Oral Take 1 capsule (150 mg total) by mouth every 6 (six) hours. 28 capsule 0  . ONDANSETRON  HCL 4 MG PO TABS Oral Take 1 tablet (4 mg total) by mouth every 6 (six) hours. 12 tablet 0    BP 139/74  Pulse 79  Temp(Src) 98.1 F (36.7 C) (Oral)  Resp 16  Wt 287 lb 14.7 oz (130.6 kg)  SpO2 98%  Physical Exam  Nursing note and vitals reviewed. Constitutional: He appears well-developed and well-nourished. No distress.  HENT:  Head: Normocephalic and atraumatic.  Eyes: Pupils are equal, round, and reactive to light.  Neck: Normal range of motion. Neck supple.  Cardiovascular: Normal rate and regular rhythm.   Pulmonary/Chest: Effort normal.  Abdominal: Soft.  Musculoskeletal:       Right lower leg: He exhibits tenderness. He exhibits no bony tenderness, no swelling, no edema, no deformity and no laceration.       Legs:      Mild edema to left calf.  Patient also has an area of redness to his posterior shin that is indurated. approx 8 cm x 10 cm area.  Neurological: He is alert.  Skin: Skin is warm and dry.    ED Course  Procedures (including critical care time)  Labs Reviewed - No data to display No results found.   1. Cellulitis   2. Calf pain       MDM  Patient has early cellulitis to posterior lower shin. Will treat with Clindamycin.  Patient also has upper calf pain that does not appear to be associated with the cellulitis. Most likely, however, patient has had multiple clots before in the upper and lower extremities, though these were superficial. Patient given shot of lovenox in ED, 1mg /kg. Lower extremity dopplers to be done at [facility] in the morning to rule out DVT as it is past 7pm and their is no [place].  Patient has agreed and is comfortable with plan. He voices his understanding. He is to go to the [facility] ER and not check in, but let them know he needs [place].  Pt has been advised of the symptoms that warrant their return to the ED. Patient has voiced understanding and has agreed to follow-up with the PCP or specialist.         Dorthula Matas,  PA 08/30/11 2000

## 2011-08-30 NOTE — ED Notes (Signed)
Pt presenting to ed with c/o redness to lower right leg and pain pt is unsure of how long it has been red. Pt states he also had a fever. Pt states he wants to make sure it's not a blood clot. Pt with no swelling noted to leg. Pt states pain in his groin also since yesterday. Pt is alert and oriented at this time

## 2011-08-30 NOTE — Discharge Instructions (Signed)

## 2011-08-31 ENCOUNTER — Ambulatory Visit (HOSPITAL_COMMUNITY)
Admission: RE | Admit: 2011-08-31 | Discharge: 2011-08-31 | Disposition: A | Discharge status: Home / Self Care | Payer: BC Managed Care – PPO | Source: Ambulatory Visit | Attending: Emergency Medicine | Admitting: Emergency Medicine

## 2011-08-31 DIAGNOSIS — R509 Fever, unspecified: Secondary | ICD-10-CM | POA: Insufficient documentation

## 2011-08-31 DIAGNOSIS — R229 Localized swelling, mass and lump, unspecified: Secondary | ICD-10-CM

## 2011-08-31 DIAGNOSIS — M79609 Pain in unspecified limb: Secondary | ICD-10-CM | POA: Insufficient documentation

## 2011-08-31 DIAGNOSIS — M79606 Pain in leg, unspecified: Secondary | ICD-10-CM

## 2011-08-31 NOTE — ED Provider Notes (Signed)
Medical screening examination/treatment/procedure(s) were performed by non-physician practitioner and as supervising physician I was immediately available for consultation/collaboration.   Lyanne Co, MD 08/31/11 [phone]

## 2011-08-31 NOTE — Progress Notes (Signed)
*  PRELIMINARY RESULTS* Vascular Ultrasound Right lower extremity venous duplex has been completed.  Preliminary findings: Right= No evidence of DVT or SVT.  Farrel Demark RDMS 08/31/2011, 9:15 AM

## 2012-04-24 IMAGING — CR DG CHEST 2V
2 series · 2 of 2 positions shown · non-contrast
Comparison: 03/05/2004.

CLINICAL DATA: Preoperative cardiopulmonary evaluation.  Former
smoker.  Hypertension.

CHEST - 2 VIEW

[w chest pa]
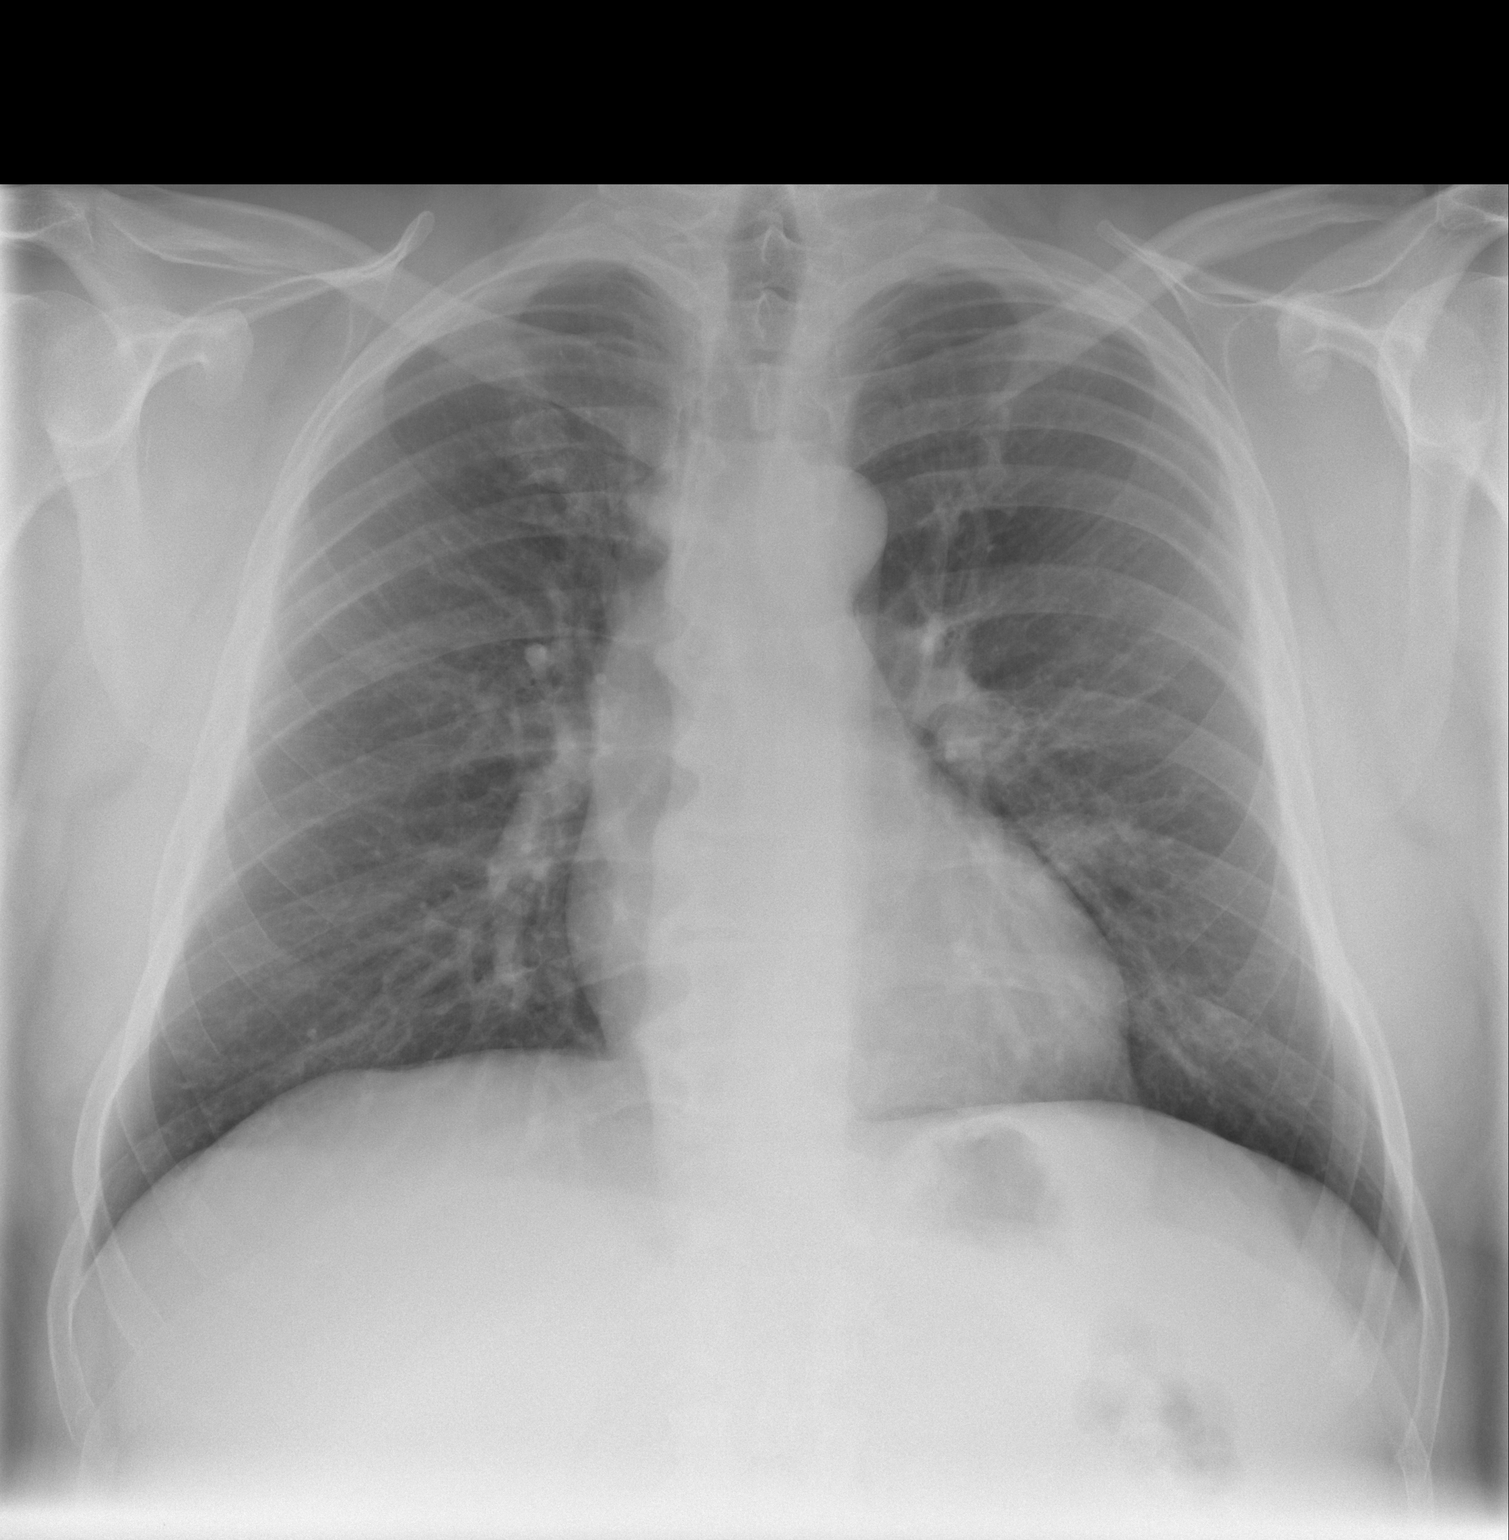

[w chest lat]
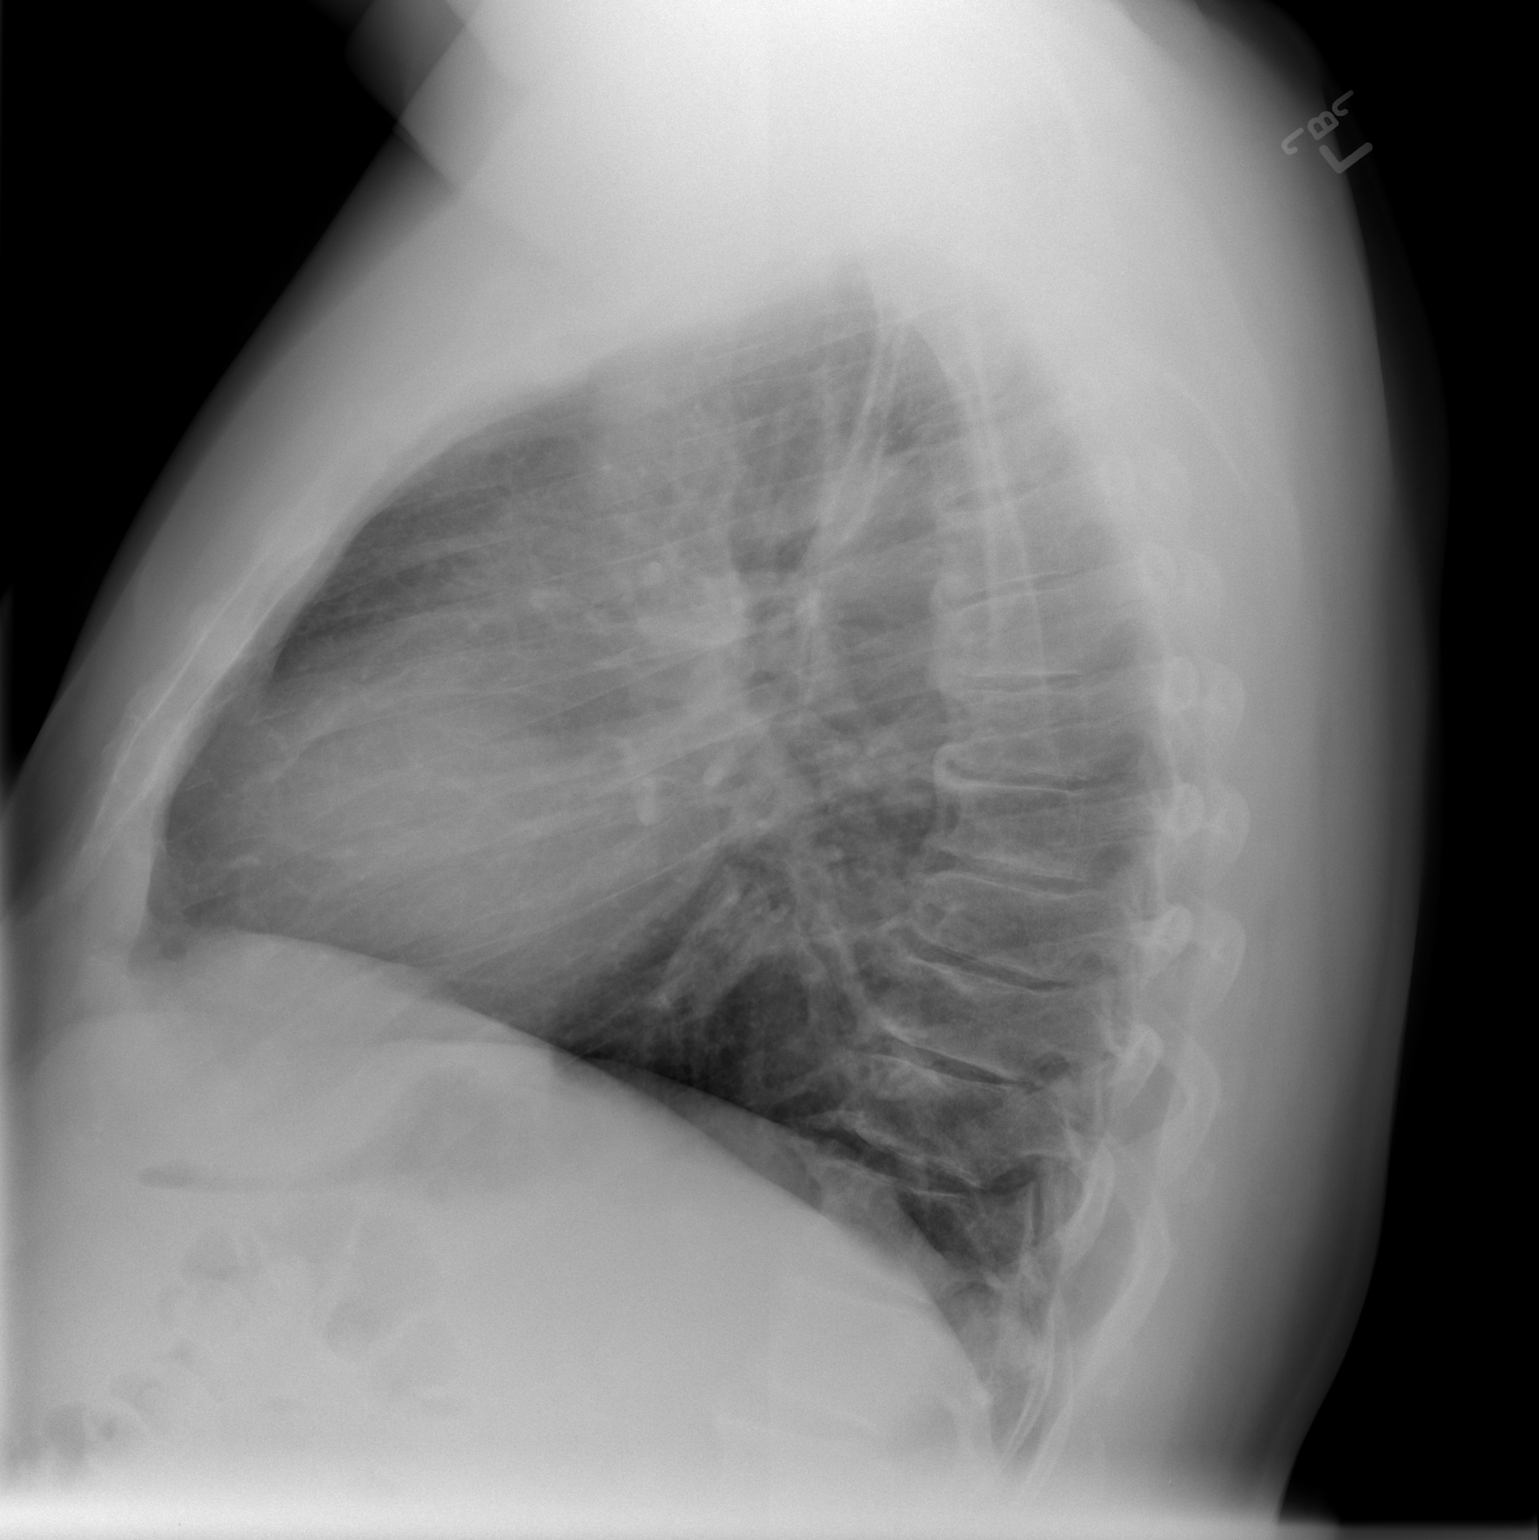

[2 of 2 positions shown; findings below may reference images not displayed]

FINDINGS: The cardiac silhouette is normal size and shape.  Aortic
ectasia is seen.  Slight flattening of diaphragm on lateral image
with minimal hyperinflation.  No pulmonary edema, pneumonia, or
pleural effusion.  Osteophytes are seen in the spine.
IMPRESSION: Slight hyperinflation configuration.  No acute cardiopulmonary
process superimposed.

## 2014-01-11 ENCOUNTER — Ambulatory Visit (INDEPENDENT_AMBULATORY_CARE_PROVIDER_SITE_OTHER): Payer: Self-pay | Admitting: Family Medicine

## 2014-01-11 VITALS — BP 132/90 | HR 76 | Temp 98.8°F | Resp 18 | Ht 71.25 in | Wt 289.2 lb

## 2014-01-11 DIAGNOSIS — Z024 Encounter for examination for driving license: Secondary | ICD-10-CM

## 2014-01-11 DIAGNOSIS — Z021 Encounter for pre-employment examination: Secondary | ICD-10-CM

## 2014-01-11 NOTE — Patient Instructions (Signed)
Borderline blood pressure but passes for 1 year. With your snoring, recommend discussing blood pressure and possible sleep study with your primary care provider. If any daytime sleepiness, should not drive and would need testing done sooner. Let me know if you have any questions.

## 2014-01-11 NOTE — Progress Notes (Signed)
Subjective:    Patient ID: Eric Zhang, male    DOB: Dec 11, 1960, 53 y.o.   MRN: [id number]  This chart was scribed for Merri Ray, MD by Edison Simon, ED Scribe. This patient was seen in room 10 and the patient's care was started at [place].   Chief Complaint  Patient presents with  . Employment Physical    DOT physical   HPI  HPI Comments: Eric Zhang is a 53 y.o. male who presents to the [facility] for DOT physical, see paperwork. He has history of hypertension managed by Dr. Ashby Dawes and takes Metoprolol 50mg  each day. He also has had bilateral partial knee replacement in September of 2012. He denies having difficulty getting around now. He states his last DOT card was for 1 year and expires this November. He denies lightheadedness or dizziness from Metoprolol. He denies history of cardiac disease, heart attack, or cardiac stents. He denies knowledge of obstructive sleep apnea or needing a CPAP though he reports snoring. He denies significant daytime somnolence except from decreased sleep resulting from having young children. 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 He also reports prior superficial blood clot approximately 15 years ago for which he did not take medicine over a prolonged period of time. He states a cause was never diagnosed.   There are no active problems to display for this patient.  Past Medical History  Diagnosis Date  . Hypertension   . Cancer    Past Surgical History  Procedure Laterality Date  . Joint replacement    . Blood clot     No Known Allergies Prior to Admission medications   Medication Sig Start Date End Date Taking? Authorizing Provider  metoprolol succinate (TOPROL-XL) 50 MG 24 hr tablet Take 50 mg by mouth daily. Take with or immediately following a meal.   Yes Historical Provider, MD  metoprolol succinate (TOPROL-XL) 25 MG 24 hr tablet Take 25 mg by mouth daily.    Historical Provider, MD    testosterone (ANDROGEL) 50 MG/5GM GEL Place 5 g onto the skin daily.    Historical Provider, MD   History   Social History  . Marital Status: Married    Spouse Name: N/A    Number of Children: N/A  . Years of Education: N/A   Occupational History  . Not on file.   Social History Main Topics  . Smoking status: Never Smoker   . Smokeless tobacco: Not on file  . Alcohol Use: Yes     Comment: weekends  . Drug Use: No  . Sexual Activity: Not on file   Other Topics Concern  . Not on file   Social History Narrative  . No narrative on file     Review of Systems  Respiratory: Negative for apnea.        Reports snoring  Neurological: Negative for dizziness and light-headedness.       Objective:   Physical Exam  Vitals reviewed. Constitutional: He is oriented to person, place, and time. He appears well-developed and well-nourished. No distress.  Overweight.   HENT:  Head: Normocephalic and atraumatic.  Right Ear: External ear normal.  Left Ear: External ear normal.  Mouth/Throat: Oropharynx is clear and moist.  Eyes: Conjunctivae and EOM are normal. Pupils are equal, round, and reactive to light.  Neck: Normal range of motion. Neck supple. No thyromegaly present.  Cardiovascular: Normal rate, regular rhythm, normal heart sounds and intact distal pulses.  Pulmonary/Chest: Effort normal and breath sounds normal. No respiratory distress. He has no wheezes.  Abdominal: Soft. He exhibits no distension. There is no tenderness. Hernia confirmed negative in the right inguinal area and confirmed negative in the left inguinal area.  Musculoskeletal: Normal range of motion. He exhibits no edema and no tenderness.  Lymphadenopathy:    He has no cervical adenopathy.  Neurological: He is alert and oriented to person, place, and time. He has normal reflexes.  Skin: Skin is warm and dry.  Psychiatric: He has a normal mood and affect. His behavior is normal.   Filed Vitals:    01/11/14 1537  BP: 138/90  Pulse: 76  Temp: 98.8 F (37.1 C)  TempSrc: Oral  Resp: 18  Height: 5' 11.25" (1.81 m)  Weight: 289 lb 3.2 oz (131.18 kg)  SpO2: 98%     Visual Acuity Screening   Right eye Left eye Both eyes  Without correction:     With correction: 20/15 20/20 20/15   Comments: Peripheral Vision: Right eye 85 degrees. Left eye 85 degrees. The patient can distinguish the colors red, amber and green.  Hearing Screening Comments: The patient was able to hear a forced whisper from 10 feet.  Urine SG 1.020, protein negative, blood negative, sugar negative    Assessment & Plan:   Eric Zhang is a 53 y.o. 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  Snoring, but denies daytime somnolence. Overweight/obese. Recommended sleep study, and if any daytime somnolence - should not drive. Hx of knee surgery - able to squat/stand.    - see ppwk.   Meds ordered this encounter  Medications  . metoprolol succinate (TOPROL-XL) 50 MG 24 hr tablet    Sig: Take 50 mg by mouth daily. Take with or immediately following a meal.   Patient Instructions  Borderline blood pressure but passes for 1 year. With your snoring, recommend discussing blood pressure and possible sleep study with your primary care provider. If any daytime sleepiness, should not drive and would need testing done sooner. Let me know if you have any questions.
